# Patient Record
Sex: Female | Born: 1960 | Hispanic: Refuse to answer
Health system: Southern US, Community
[De-identification: ages and names within clinical notes are randomized; demographics above are authoritative.]

---

## 2019-05-04 ENCOUNTER — Other Ambulatory Visit: Payer: Self-pay | Admitting: Family Medicine

## 2019-05-04 DIAGNOSIS — N6489 Other specified disorders of breast: Secondary | ICD-10-CM

## 2019-05-11 ENCOUNTER — Ambulatory Visit
Admission: RE | Admit: 2019-05-11 | Discharge: 2019-05-11 | Disposition: A | Discharge status: Home / Self Care | Payer: BC Managed Care – PPO | Source: Ambulatory Visit | Attending: Family Medicine | Admitting: Family Medicine

## 2019-05-11 ENCOUNTER — Other Ambulatory Visit: Payer: Self-pay

## 2019-05-11 DIAGNOSIS — N6489 Other specified disorders of breast: Secondary | ICD-10-CM

## 2020-10-18 IMAGING — MG MM CLIP PLACEMENT
4 series · 4 of 12 positions shown · non-contrast
Comparison: Previous exam(s).

CLINICAL DATA: Status post stereotactic guided core needle biopsy
of a focal asymmetry in the superior right breast.

EXAM:
DIAGNOSTIC RIGHT MAMMOGRAM POST STEREOTACTIC BIOPSY

[R CC synth-2D]
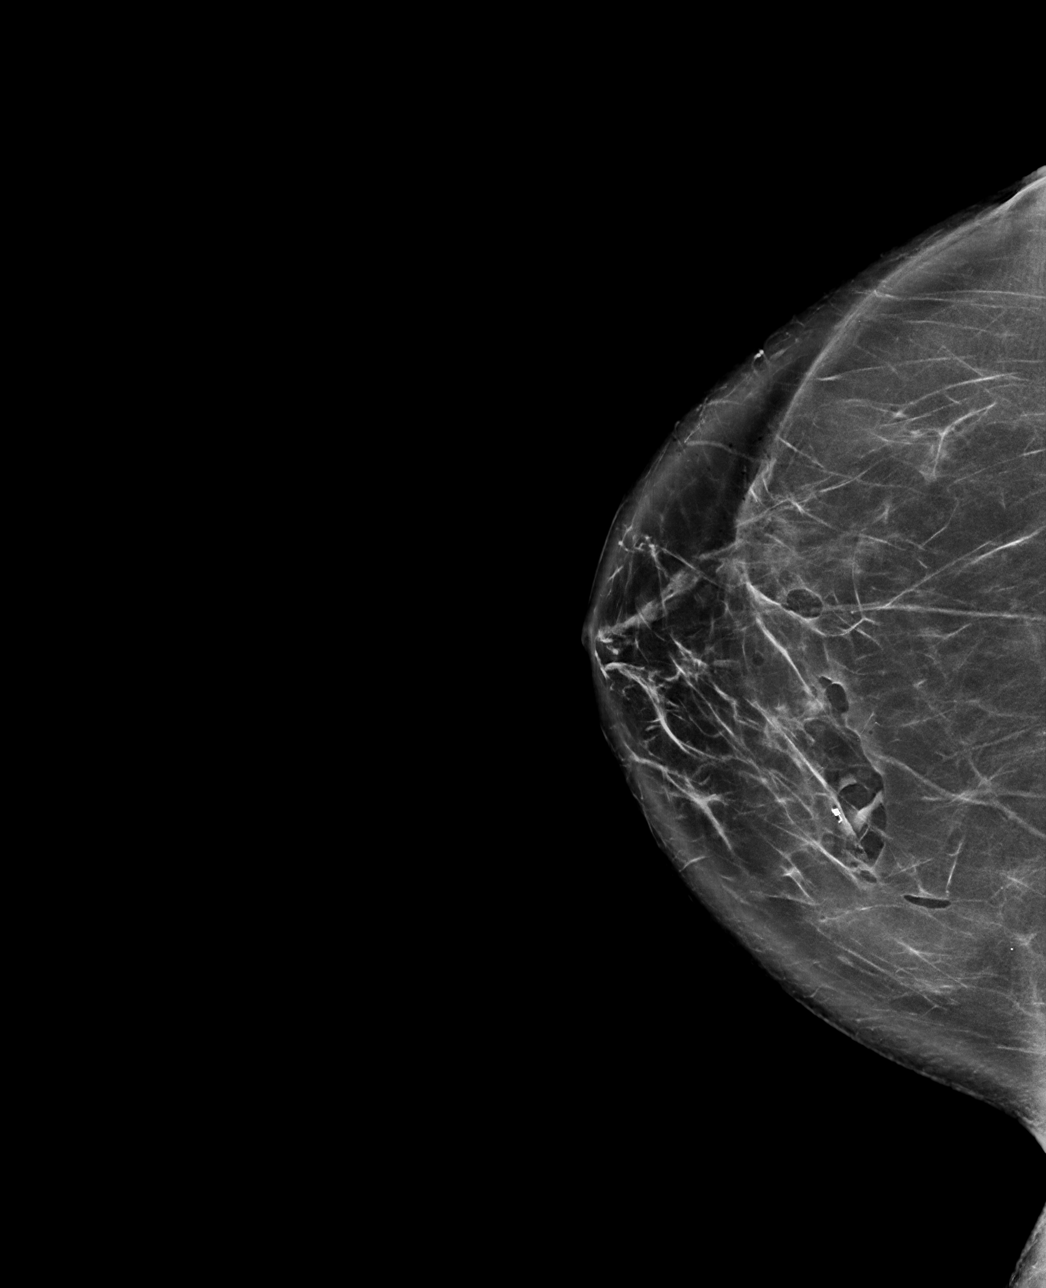

[R LM synth-2D]
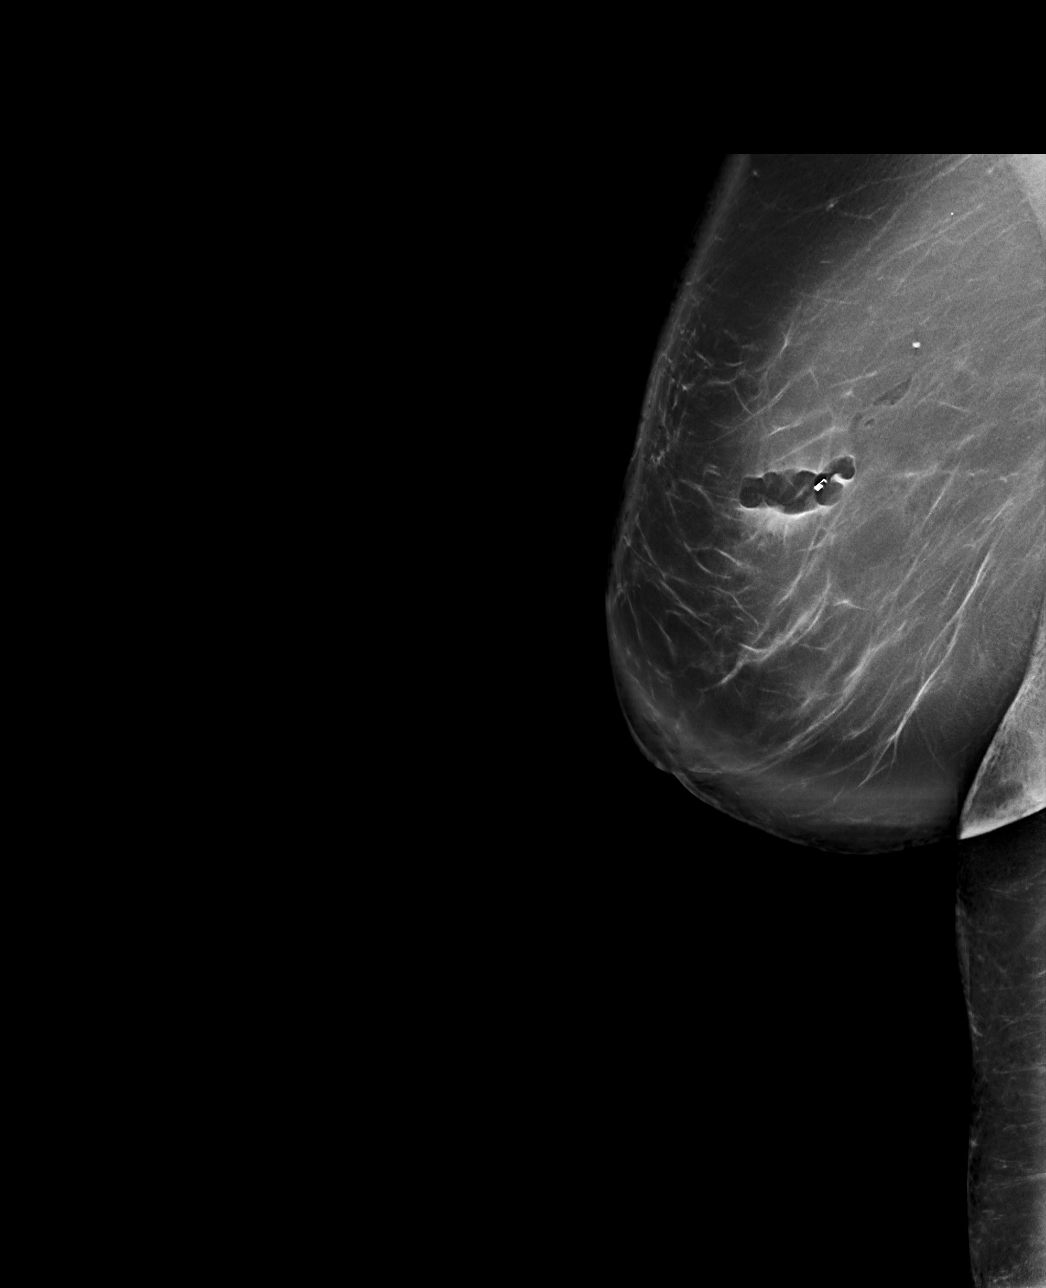

[R LM tomo · tomo slice 62/123.0]
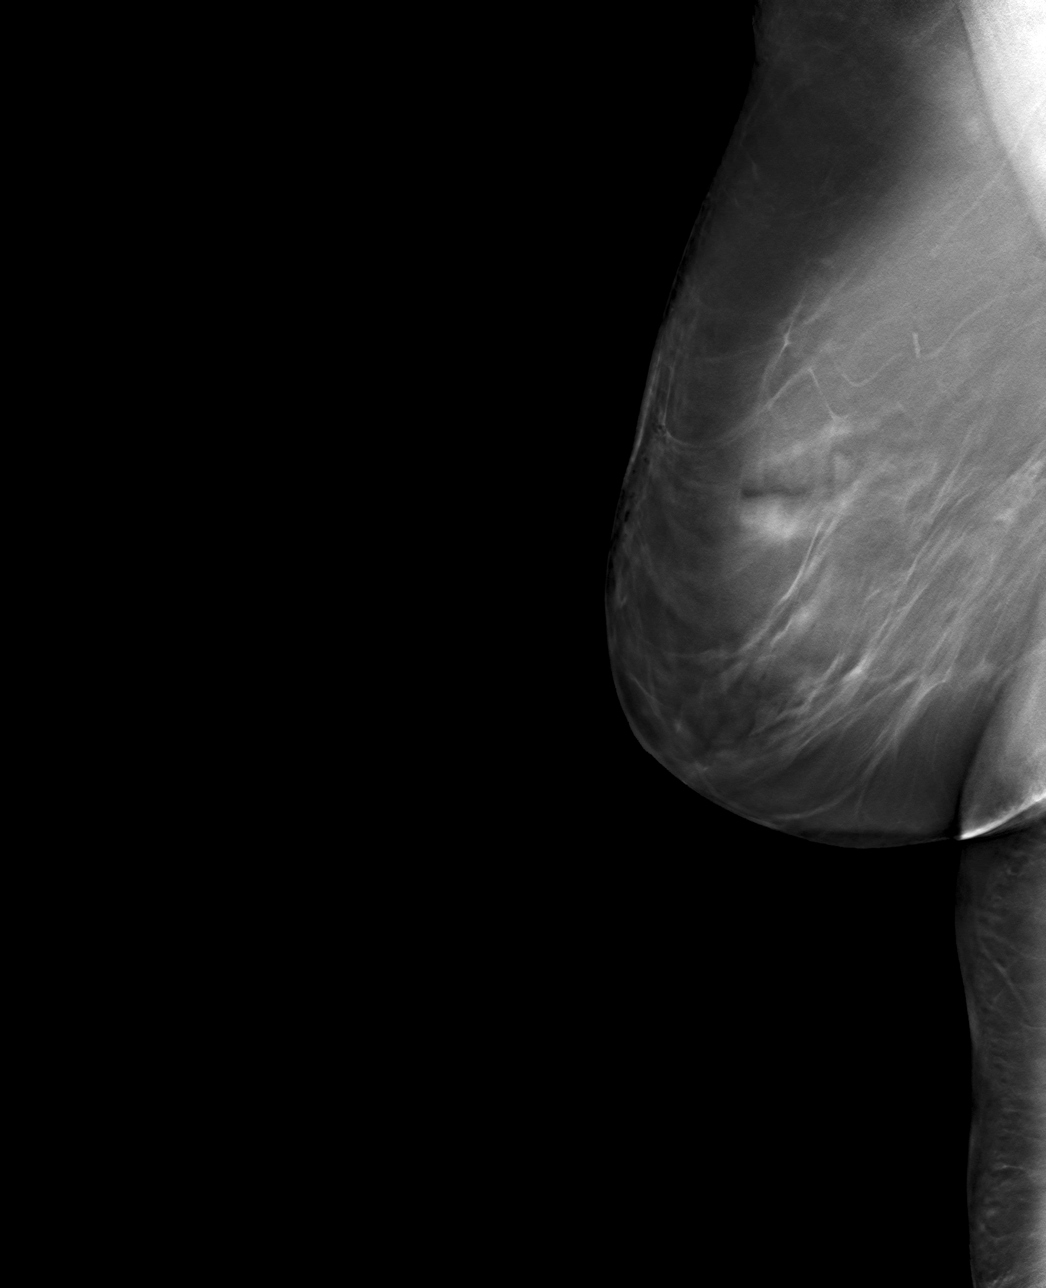

[R CC tomo · tomo slice 45/90.0]
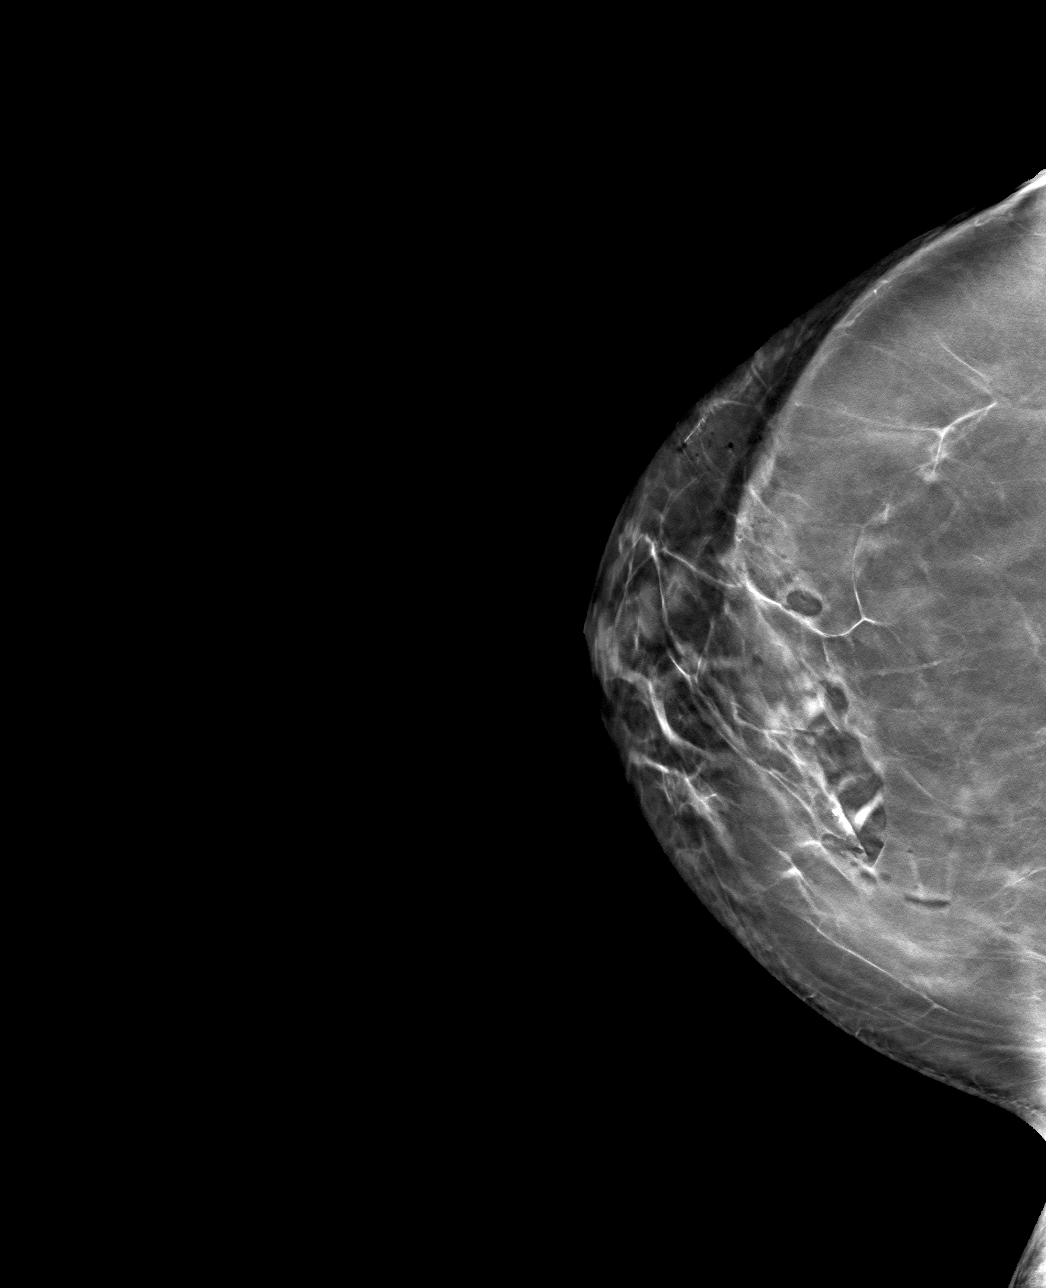

[4 of 12 positions shown; findings below may reference images not displayed]

FINDINGS: Mammographic images were obtained following stereotactic guided
biopsy of the recently demonstrated focal asymmetry in the superior
right breast. These demonstrate a coil shaped biopsy marker clip and
post biopsy changes at the expected location of the focal asymmetry
in the true lateral projection. This is located in the medial
portion of the breast in the craniocaudal projection.
IMPRESSION: Appropriate clip deployment following right breast stereotactic
guided core needle biopsy.

Final Assessment: Post Procedure Mammograms for Marker Placement

## 2022-06-17 ENCOUNTER — Ambulatory Visit (INDEPENDENT_AMBULATORY_CARE_PROVIDER_SITE_OTHER): Payer: BLUE CROSS/BLUE SHIELD | Admitting: Psychiatry

## 2022-06-17 ENCOUNTER — Encounter: Payer: Self-pay | Admitting: Psychiatry

## 2022-06-17 VITALS — BP 153/92 | HR 72 | Ht 66.0 in | Wt 222.0 lb

## 2022-06-17 DIAGNOSIS — R519 Headache, unspecified: Secondary | ICD-10-CM

## 2022-06-17 DIAGNOSIS — R251 Tremor, unspecified: Secondary | ICD-10-CM | POA: Diagnosis not present

## 2022-06-17 MED ORDER — TOPIRAMATE 25 MG PO TABS: ORAL_TABLET | ORAL | 6 refills | Status: AC

## 2022-06-17 MED ORDER — RIZATRIPTAN BENZOATE 10 MG PO TABS: 10.0000 mg | ORAL_TABLET | ORAL | 6 refills | Status: AC | PRN

## 2022-06-17 NOTE — Progress Notes (Signed)
[facility]  PATIENT: Susan Hahn DOB: 04/27/1961  REFERRING CLINICIAN: Jerrye Bushy, FNP HISTORY FROM: self REASON FOR VISIT: headaches, weakness   HISTORICAL  CHIEF COMPLAINT:  Chief Complaint  Patient presents with   Headache    Rm 3 alone Pt is well, has been having weakness and pressure headaches since Jan 2022 post covid.     HISTORY OF PRESENT ILLNESS:  The patient present for evaluation of headaches and weakness which began after COVID infection in January 2022. During this infection she was admitted to the hospital for pneumonia. Since COVID she has had headaches, brain fog, generalized weakness, and shortness of breath.  Headaches first felt like intermittent light pressure squeezing her head. Then in June 2022 she developed severe holocephalic pressure. Initially thought this was related to her sinuses so she tried sinus medications but these were ineffective. Headaches continued as constant pressure with associated photophobia, phonophobia, and nausea. She does have a history of migraines. She has been to the ED 5 times for her headaches and did get some relief from a headache cocktail. She has also been given gabapentin and tramadol which helped temporarily but pain returned so she stopped taking them.  Also developed intermittent brain fog and memory issues, but these have improved over time. She then developed dizziness and difficulty walking. She is now shaking on the left side as well. States she has also been having tics and making sounds involuntarily.    OTHER MEDICAL CONDITIONS: hypothyroidism, HLD, HTN   REVIEW OF SYSTEMS: Full 14 system review of systems performed and negative with exception of: headaches, brain fog, shortness of breath  ALLERGIES: Not on File  HOME MEDICATIONS: Outpatient Medications Prior to Visit  Medication Sig Dispense Refill   metoprolol tartrate (LOPRESSOR) 25 MG tablet Take 25 mg by mouth daily.      Vitamin D, Ergocalciferol, (DRISDOL) 1.25 MG (50000 UNIT) CAPS capsule Take 50,000 Units by mouth once a week.     No facility-administered medications prior to visit.    PAST MEDICAL HISTORY: History reviewed. No pertinent past medical history.  PAST SURGICAL HISTORY: History reviewed. No pertinent surgical history.  FAMILY HISTORY: History reviewed. No pertinent family history.  SOCIAL HISTORY: Social History   Socioeconomic History   Marital status: Widowed    Spouse name: Not on file   Number of children: Not on file   Years of education: Not on file   Highest education level: Not on file  Occupational History   Not on file  Tobacco Use   Smoking status: Not on file   Smokeless tobacco: Not on file  Substance and Sexual Activity   Alcohol use: Not on file   Drug use: Not on file   Sexual activity: Not on file  Other Topics Concern   Not on file  Social History Narrative   Not on file   Social Determinants of Health   Financial Resource Strain: Not on file  Food Insecurity: Not on file  Transportation Needs: Not on file  Physical Activity: Not on file  Stress: Not on file  Social Connections: Not on file  Intimate Partner Violence: Not on file     PHYSICAL EXAM  GENERAL EXAM/CONSTITUTIONAL: Vitals:  Vitals:   06/17/22 0921  BP: (!) 153/92  Pulse: 72  Weight: 222 lb (100.7 kg)  Height: 5\' 6"  (1.676 m)   Body mass index is 35.83 kg/m. Wt Readings from Last 3 Encounters:  06/17/22 222 lb (100.7 kg)  NEUROLOGIC: MENTAL STATUS:  awake, alert, oriented to person, place and time recent and remote memory intact normal attention and concentration  CRANIAL NERVE:  2nd, 3rd, 4th, 6th - pupils equal and reactive to light, decreased vision LLQ OD, extraocular muscles intact, no nystagmus 5th - diminished sensation left V1-3, splits midline to tuning fork 7th - facial strength symmetric 8th - hearing intact 9th - palate elevates symmetrically, uvula  midline 11th - shoulder shrug symmetric 12th - tongue protrusion midline  MOTOR:  Giveway weakness with right arm abduction, left hip flexion/knee extension/knee flexion, otherwise 5/5 throughout  SENSORY:  normal and symmetric to light touch all 4 extremities  COORDINATION:  Mild postural tremor bilateral finger-nose-finger without dysmetria  REFLEXES:  deep tendon reflexes present and symmetric  GAIT/STATION:  normal     DIAGNOSTIC DATA (LABS, IMAGING, TESTING) - I reviewed patient records, labs, notes, testing and imaging myself where available.  Recent CTH reportedly normal per patient   ASSESSMENT AND PLAN  61 y.o. year old female with a history of hypothyroidism, HLD, HTN who presents for evaluation of headaches, tics, tremors, and weakness following COVID infection. No tics observed during today's visit. Her exam has features of functional overlay including midline split and giveway weakness. Will order brain MRI for worsening headaches and decreased vision in LLQ OD. Headaches are most consistent with new daily persistent headache with migrainous features, likely triggered by her COVID infection. Will start Topamax for headache prevention and Maxalt for rescue. She started metoprolol one month ago for blood pressure, this may also help with her tremors and headaches.   1. Worsening headaches       PLAN: -MRI brain  -Start Topamax 25 mg QHS, increase by 25 mg weekly up to 100 mg QHS for headache prevention -Start Maxalt 10 mg PRN for migraine rescue -consider increasing metoprolol for headaches/tremors, consider PT for generalized weakness  Orders Placed This Encounter  Procedures   MR BRAIN W WO CONTRAST    Meds ordered this encounter  Medications   rizatriptan (MAXALT) 10 MG tablet    Sig: Take 1 tablet (10 mg total) by mouth as needed for migraine. May repeat in 2 hours if needed. Max dose 2 pills in 24 hours    Dispense:  10 tablet    Refill:  6    topiramate (TOPAMAX) 25 MG tablet    Sig: Take 25 mg (1 pill) at bedtime for one week, then increase to 50 mg (2 pills) at bedtime for one week, then take 75 mg (3 pills) at bedtime for one week, then take 100 mg (4 pills) at bedtime    Dispense:  120 tablet    Refill:  6    Return in about 3 months (around 09/17/2022).    Ocie Doyne, MD 06/17/22 10:07 AM  I spent an average of 39 minutes chart reviewing and counseling the patient, with at least 50% of the time face to face with the patient.   [facility] [address] [place], [place] [address] [phone]

## 2022-06-17 NOTE — Patient Instructions (Signed)
Plan: Susan Hahn MRI  Start Topamax daily at bedtime for headache/head pressure prevention. Take 25 mg (1 pill) at bedtime for one week, then increase to 50 mg (2 pills) at bedtime for one week, then take 75 mg (3 pills) at bedtime for one week, then take 100 mg (4 pills) at bedtime and continue on this dose   Start rizatriptan as needed for migraines. Take at the onset of migraine. If headache recurs or does not fully resolve, you may take a second dose after 2 hours. Please avoid taking more than 2 days per week to avoid rebound headaches

## 2022-06-30 ENCOUNTER — Telehealth: Payer: Self-pay | Admitting: Psychiatry

## 2022-06-30 NOTE — Telephone Encounter (Signed)
[facility] Susan Hahn) could someone reach out to the patient with an update on MRI. She is calling our office wanting to know what's going on. [phone].

## 2022-06-30 NOTE — Telephone Encounter (Signed)
Pt following up on MRI being scheduled. Have been to the ED for pressure in head. Would like a call back to get MRI scheduled.

## 2022-06-30 NOTE — Telephone Encounter (Signed)
[employer] Berkley Harvey is pending, anticipated determination date 07/01/22. It was submitted 06/26/22.

## 2022-07-01 NOTE — Telephone Encounter (Signed)
MRI is not getting approved by the office notes, need a peer to peer phone call [phone]  case number [id number]

## 2022-07-02 NOTE — Telephone Encounter (Signed)
Pt said went and got MRI done [facility] in [place]. PCP read the MRI and results were negative. Would like a call from the nurse to discuss what to do next.

## 2022-07-02 NOTE — Telephone Encounter (Signed)
She should continue Topamax for now. I'd give it 6-8 weeks to give time for it to build in her system

## 2022-07-02 NOTE — Telephone Encounter (Signed)
LVM advising patient of Dr Quentin Mulling recommendations. Left # for further questions.

## 2022-07-02 NOTE — Telephone Encounter (Signed)
Based on this denial letter, it looks like they are denying it because it is scheduled to be done in a hospital? Can we just refer her to an outpatient imaging center?

## 2022-07-02 NOTE — Telephone Encounter (Signed)
Received MRI brain report form [facility], dated 06/30/2022. Placed on Dr Quentin Mulling desk for review.

## 2022-07-02 NOTE — Telephone Encounter (Signed)
Faxed request to [facility]  medical records requesting MRI report.

## 2022-07-06 MED ORDER — METHYLPREDNISOLONE 4 MG PO TBPK: ORAL_TABLET | ORAL | 0 refills | Status: AC

## 2022-07-06 NOTE — Telephone Encounter (Signed)
I sent a prednisone taper to help with her headaches. PCP should fill out her short term disability as she has medical causes for her symptoms including shortness of breath from prior COVID pneumonia

## 2022-07-06 NOTE — Telephone Encounter (Signed)
Pt is calling. Requesting a nurse give her a call to discuss the message she sent last week

## 2022-07-06 NOTE — Telephone Encounter (Signed)
Contacted pt, informed her of Dr Quentin Mulling recommendations and she was appreciative, she also mention she got STD figured out with PCP, they was throwing away her new forms and assuming she could use old ones.

## 2022-07-06 NOTE — Addendum Note (Signed)
Addended by: Ocie Doyne on: 07/06/2022 11:39 AM   Modules accepted: Orders

## 2022-07-06 NOTE — Telephone Encounter (Signed)
Contacted pt back, she stated she has been taking the topamax for weeks and she cant even walk or do anything due to the pain, she is requesting something else me done.  She also mentioned her PCP informed her we are suppose to her STD. She is unable to work and can barely walk.. I asked her if it was for headaches she said its for that, the pressure and the pain.

## 2022-07-07 ENCOUNTER — Telehealth: Payer: Self-pay

## 2022-07-07 NOTE — Telephone Encounter (Signed)
Received fax from [facility] MRI Head Without Contrast. Placed on MD desk for review.

## 2022-09-08 ENCOUNTER — Ambulatory Visit: Payer: BLUE CROSS/BLUE SHIELD | Admitting: Neurology

## 2022-09-16 ENCOUNTER — Ambulatory Visit: Payer: BLUE CROSS/BLUE SHIELD | Admitting: Neurology

## 2024-11-16 ENCOUNTER — Ambulatory Visit

## 2024-11-29 ENCOUNTER — Ambulatory Visit

## 2024-11-29 ENCOUNTER — Ambulatory Visit: Payer: Self-pay
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
## 2024-12-06 ENCOUNTER — Ambulatory Visit: Admitting: Family Medicine
